# Patient Record
Sex: Female | Born: 2005 | Race: Black or African American | Hispanic: No | Marital: Single
Health system: Southern US, Community
[De-identification: ages and names within clinical notes are randomized; demographics above are authoritative.]

---
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
## 2023-01-28 IMAGING — DX DG KNEE COMPLETE 4+V*L*
4 series · 4 of 4 positions shown · non-contrast
Comparison: None.

CLINICAL DATA: Fall 3 days ago. Left knee pain and swelling.
Initial encounter.

EXAM:
LEFT KNEE - COMPLETE 4+ VIEW

[knee ap]
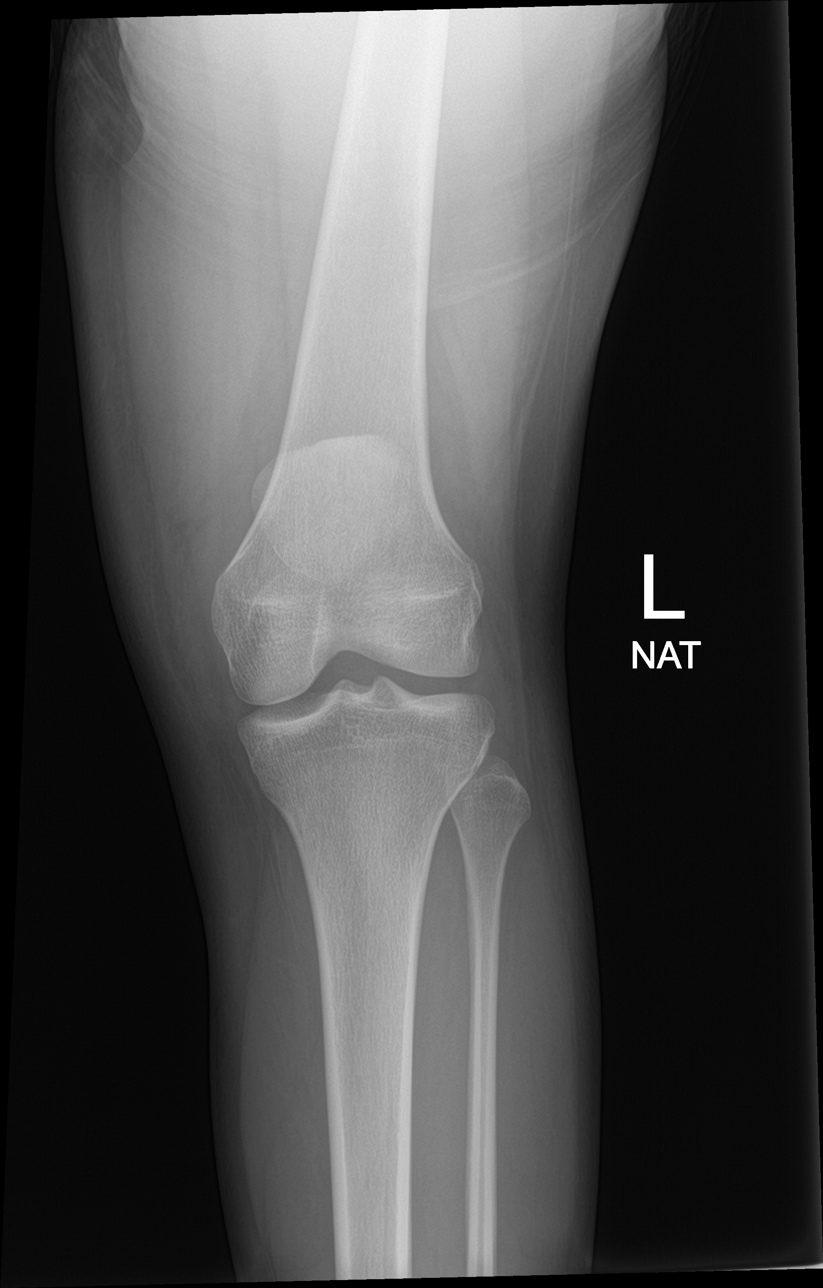

[knee obl (1 of 2)]
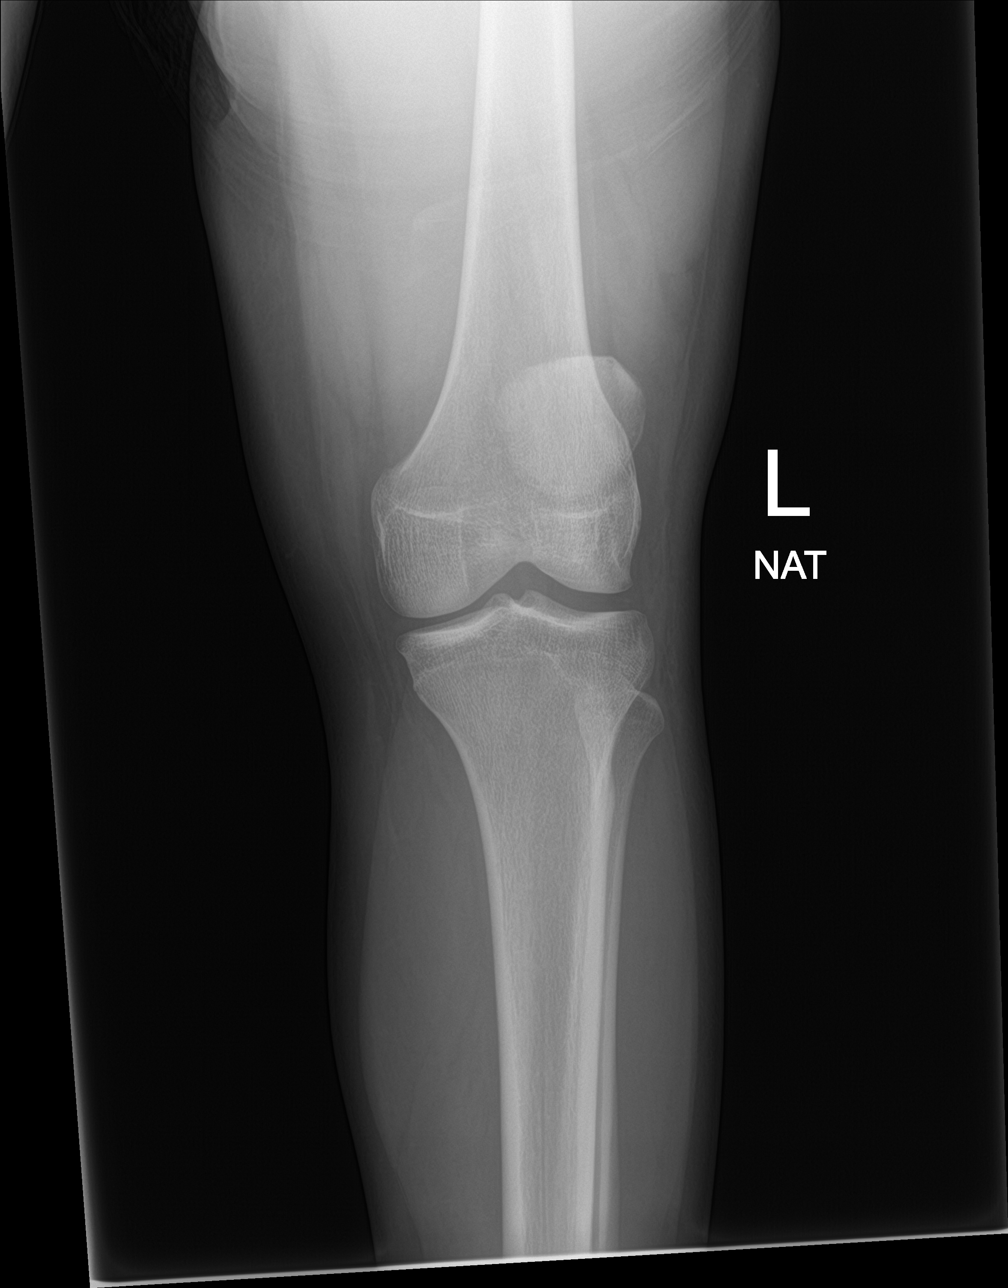

[knee obl (2 of 2)]
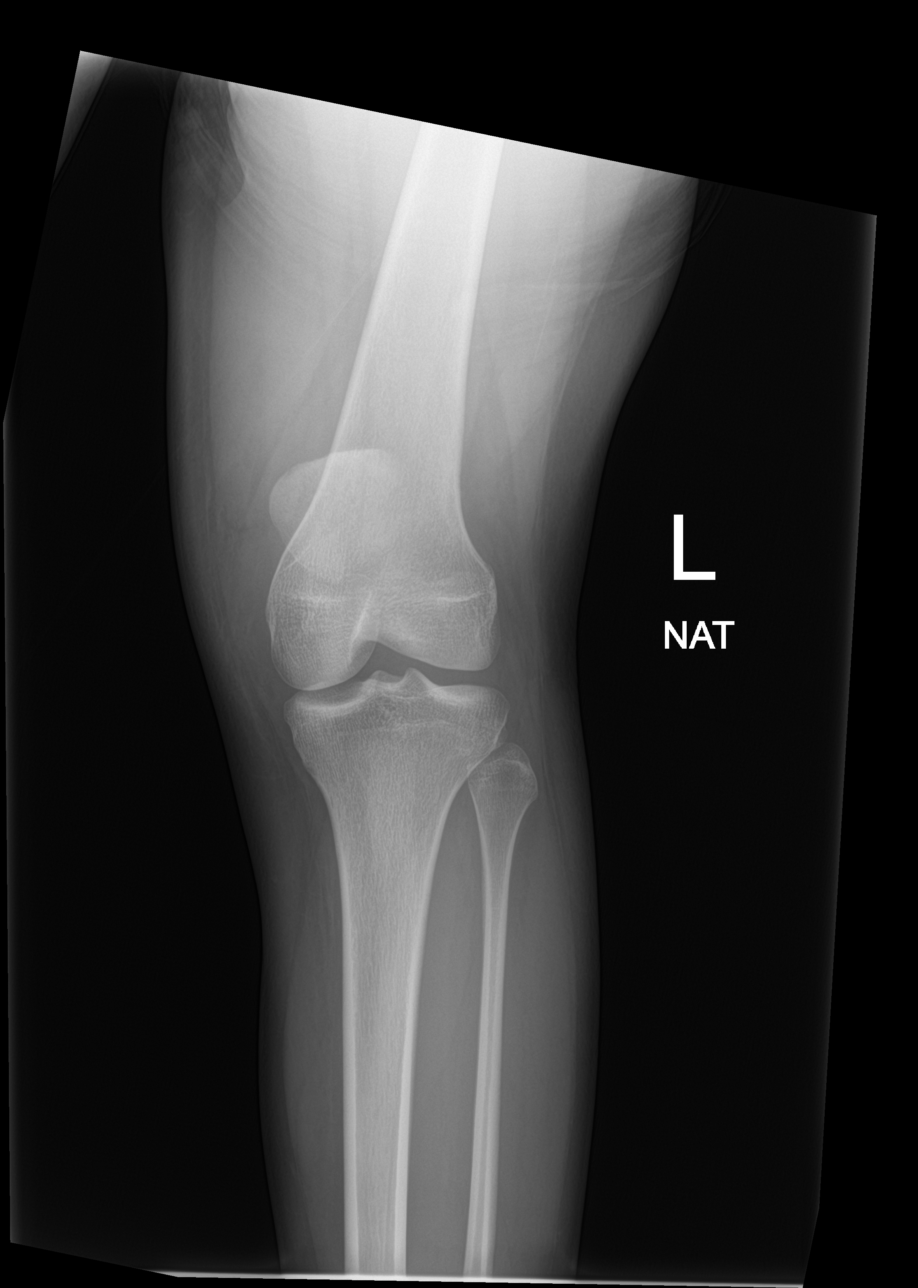

[knee lat]
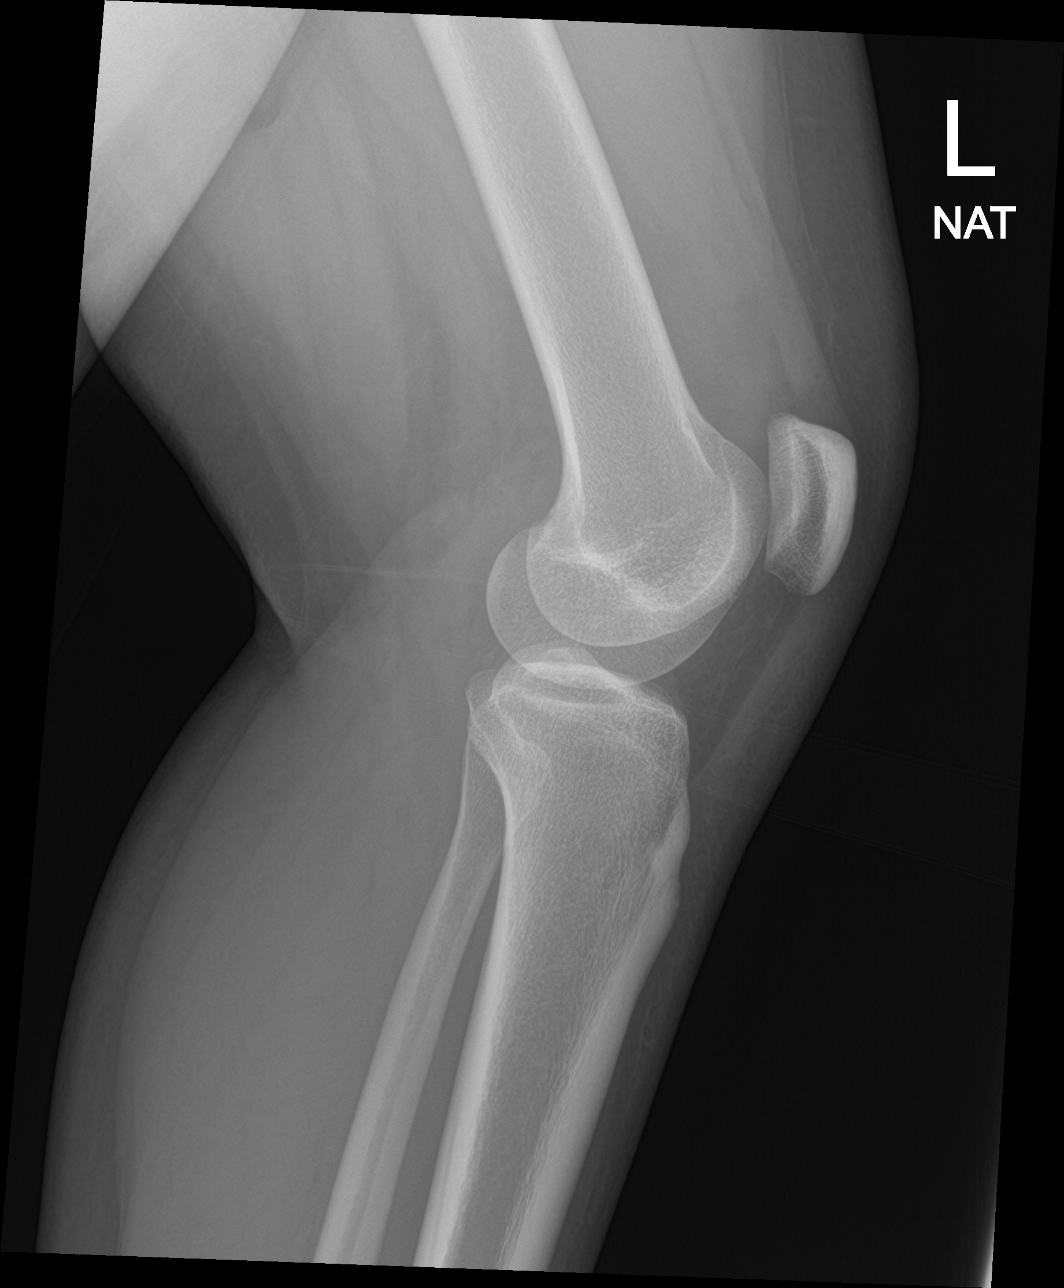

[4 of 4 positions shown; findings below may reference images not displayed]

FINDINGS: No evidence of fracture, dislocation, or joint effusion. No evidence
of arthropathy or other focal bone abnormality. Soft tissues are
unremarkable.
IMPRESSION: Negative.

## 2023-08-26 ENCOUNTER — Ambulatory Visit (HOSPITAL_COMMUNITY)
Admission: RE | Admit: 2023-08-26 | Discharge: 2023-08-26 | Disposition: A | Discharge status: Home / Self Care | Payer: PPO | Source: Ambulatory Visit | Attending: Family Medicine | Admitting: Family Medicine

## 2023-08-26 ENCOUNTER — Encounter (HOSPITAL_COMMUNITY): Payer: Self-pay

## 2023-08-26 VITALS — BP 119/76 | HR 62 | Temp 98.9°F | Resp 16

## 2023-08-26 DIAGNOSIS — Z113 Encounter for screening for infections with a predominantly sexual mode of transmission: Secondary | ICD-10-CM | POA: Diagnosis present

## 2023-08-26 NOTE — ED Triage Notes (Signed)
Pt here  for routine STD testing. Denies sx's. States has had unprotected intercourse.

## 2023-08-26 NOTE — ED Provider Notes (Addendum)
  [facility]   [id number] 08/26/23 Arrival Time: 1258  ASSESSMENT & PLAN:  1. Screening for STDs (sexually transmitted diseases)      Discharge Instructions      We have sent testing for sexually transmitted infections. We will notify you of any positive results once they are received. If required, we will prescribe any medications you might need.  Please refrain from all sexual activity for at least the next seven days.     Without s/s of PID.  Labs Reviewed  CERVICOVAGINAL ANCILLARY ONLY   Declines HIV/RPR.    Will notify of any positive results. Instructed to refrain from sexual activity for at least seven days.  Reviewed expectations re: course of current medical issues. Questions answered. Outlined signs and symptoms indicating need for more acute intervention. Patient verbalized understanding. After Visit Summary given.   SUBJECTIVE:  Monique Colon is a 18 y.o. female who requests STI testing. No symptoms. Recent unprotected intercourse.   Patient's last menstrual period was 08/04/2023 (approximate).   OBJECTIVE:  Vitals:   08/26/23 1342  BP: 119/76  Pulse: 62  Resp: 16  Temp: 98.9 F (37.2 C)  TempSrc: Oral  SpO2: 98%     General appearance: alert, cooperative, appears stated age and no distress Lungs: unlabored respirations; speaks full sentences without difficulty Back: no CVA tenderness; FROM at waist Abdomen: soft, non-tender GU: deferred Skin: warm and dry Psychological: alert and cooperative; normal mood and affect.    Labs Reviewed  CERVICOVAGINAL ANCILLARY ONLY    No Known Allergies  History reviewed. No pertinent past medical history. History reviewed. No pertinent family history. Social History   Socioeconomic History   Marital status: Single    Spouse name: Not on file   Number of children: Not on file   Years of education: Not on file   Highest education level: Not on file  Occupational History    Not on file  Tobacco Use   Smoking status: Passive Smoke Exposure - Never Smoker   Smokeless tobacco: Never  Vaping Use   Vaping status: Every Day  Substance and Sexual Activity   Alcohol use: Not Currently   Drug use: Yes    Types: Marijuana   Sexual activity: Yes    Birth control/protection: None  Other Topics Concern   Not on file  Social History Narrative   Not on file   Social Drivers of Health   Financial Resource Strain: Not on File (01/16/2021)   Received from [employer]    Financial Resource Strain: 0  Food Insecurity: Not on File (01/16/2021)   Received from [facility] Insecurity    Food: 0  Transportation Needs: Not on File (01/16/2021)   Received from [employer] Needs    Transportation: 0  Physical Activity: Not on File (01/16/2021)   Received from [facility]   Physical Activity    Physical Activity: 0  Stress: Not on File (01/16/2021)   Received from [facility]   Stress    Stress: 0  Social Connections: Not on File (01/16/2021)   Received from [facility]   Social Connections    Connectedness: 0  Intimate Partner Violence: Not on file           [place], MD 08/26/23 1636    Mardella Layman, MD 08/26/23 [phone]

## 2023-08-26 NOTE — Discharge Instructions (Signed)
We have sent testing for sexually transmitted infections. We will notify you of any positive results once they are received. If required, we will prescribe any medications you might need.  Please refrain from all sexual activity for at least the next seven days.

## 2023-08-28 LAB — CERVICOVAGINAL ANCILLARY ONLY
Chlamydia: NEGATIVE
Comment: NEGATIVE
Comment: NEGATIVE
Comment: NORMAL
Neisseria Gonorrhea: NEGATIVE
Trichomonas: NEGATIVE

## 2024-04-03 ENCOUNTER — Encounter (HOSPITAL_COMMUNITY): Payer: Self-pay

## 2024-04-03 ENCOUNTER — Other Ambulatory Visit: Payer: Self-pay

## 2024-04-03 ENCOUNTER — Inpatient Hospital Stay (HOSPITAL_COMMUNITY)
Admission: RE | Admit: 2024-04-03 | Discharge: 2024-04-03 | Discharge status: Home / Self Care | Source: Ambulatory Visit | Attending: Internal Medicine | Admitting: Internal Medicine

## 2024-04-03 VITALS — BP 103/67 | HR 62 | Temp 98.0°F | Resp 18

## 2024-04-03 DIAGNOSIS — Z113 Encounter for screening for infections with a predominantly sexual mode of transmission: Secondary | ICD-10-CM | POA: Insufficient documentation

## 2024-04-03 NOTE — Discharge Instructions (Addendum)
 Screening swab done today and results will be available in 24-48 hours. We will contact you if we need to arrange additional treatment based on your testing. Negative results will be on your MyChart account. Abstain from sex until you receive your final results.  Use a condom for sexual encounters. If you have any worsening or changing symptoms including abnormal discharge, pelvic pain, abdominal pain, fever, nausea, or vomiting, then you should be reevaluated.

## 2024-04-03 NOTE — ED Triage Notes (Signed)
Pt requesting to be check for STD.

## 2024-04-03 NOTE — ED Provider Notes (Signed)
 [facility]    CSN: [id number] Arrival date & time: 04/03/24  1348      History   Chief Complaint Chief Complaint  Patient presents with   SEXUALLY TRANSMITTED DISEASE    Entered by patient    HPI Monique Colon is a 18 y.o. female.   18 year old female presents urgent care requesting screening examination for STI.  She denies any vaginal discharge, pain, dysuria, hematuria or abdominal pain.  She denies any known exposures.     History reviewed. No pertinent past medical history.  There are no active problems to display for this patient.   History reviewed. No pertinent surgical history.  OB History   No obstetric history on file.      Home Medications    Prior to Admission medications   Not on File    Family History History reviewed. No pertinent family history.  Social History Social History   Tobacco Use   Smoking status: Passive Smoke Exposure - Never Smoker   Smokeless tobacco: Never  Vaping Use   Vaping status: Every Day  Substance Use Topics   Alcohol use: Not Currently   Drug use: Yes    Types: Marijuana     Allergies   Patient has no known allergies.   Review of Systems Review of Systems  Constitutional:  Negative for chills and fever.  HENT:  Negative for ear pain and sore throat.   Eyes:  Negative for pain and visual disturbance.  Respiratory:  Negative for cough and shortness of breath.   Cardiovascular:  Negative for chest pain and palpitations.  Gastrointestinal:  Negative for abdominal pain and vomiting.  Genitourinary:  Negative for dysuria and hematuria.  Musculoskeletal:  Negative for arthralgias and back pain.  Skin:  Negative for color change and rash.  Neurological:  Negative for seizures and syncope.  All other systems reviewed and are negative.    Physical Exam Triage Vital Signs ED Triage Vitals [04/03/24 1415]  Encounter Vitals Group     BP 103/67     Girls Systolic BP Percentile       Girls Diastolic BP Percentile      Boys Systolic BP Percentile      Boys Diastolic BP Percentile      Pulse Rate 62     Resp 18     Temp 98 F (36.7 C)     Temp Source Oral     SpO2 98 %     Weight      Height      Head Circumference      Peak Flow      Pain Score 0     Pain Loc      Pain Education      Exclude from Growth Chart    No data found.  Updated Vital Signs BP 103/67 (BP Location: Right Arm)   Pulse 62   Temp 98 F (36.7 C) (Oral)   Resp 18   LMP 03/21/2024 (Exact Date)   SpO2 98%   Visual Acuity Right Eye Distance:   Left Eye Distance:   Bilateral Distance:    Right Eye Near:   Left Eye Near:    Bilateral Near:     Physical Exam Vitals and nursing note reviewed.  Constitutional:      General: She is not in acute distress.    Appearance: She is well-developed.  HENT:     Head: Normocephalic and atraumatic.  Eyes:     Conjunctiva/sclera:  Conjunctivae normal.  Cardiovascular:     Rate and Rhythm: Normal rate and regular rhythm.     Heart sounds: No murmur heard. Pulmonary:     Effort: Pulmonary effort is normal. No respiratory distress.     Breath sounds: Normal breath sounds.  Abdominal:     Palpations: Abdomen is soft.     Tenderness: There is no abdominal tenderness.  Musculoskeletal:        General: No swelling.     Cervical back: Neck supple.  Skin:    General: Skin is warm and dry.     Capillary Refill: Capillary refill takes less than 2 seconds.  Neurological:     Mental Status: She is alert.  Psychiatric:        Mood and Affect: Mood normal.      UC Treatments / Results  Labs (all labs ordered are listed, but only abnormal results are displayed) Labs Reviewed  CERVICOVAGINAL ANCILLARY ONLY    EKG   Radiology No results found.  Procedures Procedures (including critical care time)  Medications Ordered in UC Medications - No data to display  Initial Impression / Assessment and Plan / UC Course  I have reviewed the  triage vital signs and the nursing notes.  Pertinent labs & imaging results that were available during my care of the patient were reviewed by me and considered in my medical decision making (see chart for details).     Screening examination for STI   Screening swab done today and results will be available in 24-48 hours. We will contact you if we need to arrange additional treatment based on your testing. Negative results will be on your MyChart account. Abstain from sex until you receive your final results.  Use a condom for sexual encounters. If you have any worsening or changing symptoms including abnormal discharge, pelvic pain, abdominal pain, fever, nausea, or vomiting, then you should be reevaluated.    Final Clinical Impressions(s) / UC Diagnoses   Final diagnoses:  Screening examination for STI     Discharge Instructions      Screening swab done today and results will be available in 24-48 hours. We will contact you if we need to arrange additional treatment based on your testing. Negative results will be on your MyChart account. Abstain from sex until you receive your final results.  Use a condom for sexual encounters. If you have any worsening or changing symptoms including abnormal discharge, pelvic pain, abdominal pain, fever, nausea, or vomiting, then you should be reevaluated.      ED Prescriptions   None    PDMP not reviewed this encounter.   Teresa Almarie LABOR, PA-C 04/03/24 1445

## 2024-04-05 LAB — CERVICOVAGINAL ANCILLARY ONLY
Chlamydia: POSITIVE — AB
Comment: NEGATIVE
Comment: NEGATIVE
Comment: NORMAL
Neisseria Gonorrhea: NEGATIVE
Trichomonas: POSITIVE — AB

## 2024-04-06 ENCOUNTER — Ambulatory Visit (HOSPITAL_COMMUNITY): Payer: Self-pay

## 2024-04-06 MED ORDER — METRONIDAZOLE 500 MG PO TABS: 500.0000 mg | ORAL_TABLET | Freq: Two times a day (BID) | ORAL | 0 refills | Status: AC

## 2024-04-06 MED ORDER — DOXYCYCLINE HYCLATE 100 MG PO TABS: 100.0000 mg | ORAL_TABLET | Freq: Two times a day (BID) | ORAL | 0 refills | Status: AC

## 2024-05-18 ENCOUNTER — Ambulatory Visit (HOSPITAL_COMMUNITY)

## 2024-06-27 ENCOUNTER — Ambulatory Visit (HOSPITAL_COMMUNITY)

## 2024-07-11 ENCOUNTER — Encounter (HOSPITAL_COMMUNITY): Payer: Self-pay

## 2024-07-11 ENCOUNTER — Ambulatory Visit (HOSPITAL_COMMUNITY)
Admission: RE | Admit: 2024-07-11 | Discharge: 2024-07-11 | Disposition: A | Discharge status: Home / Self Care | Source: Ambulatory Visit | Attending: Student

## 2024-07-11 VITALS — BP 108/66 | HR 74 | Temp 98.5°F | Resp 16

## 2024-07-11 DIAGNOSIS — Z3202 Encounter for pregnancy test, result negative: Secondary | ICD-10-CM

## 2024-07-11 DIAGNOSIS — Z113 Encounter for screening for infections with a predominantly sexual mode of transmission: Secondary | ICD-10-CM

## 2024-07-11 LAB — POCT URINE PREGNANCY: Preg Test, Ur: NEGATIVE

## 2024-07-11 NOTE — ED Provider Notes (Signed)
 [facility]    CSN: [id number] Arrival date & time: 07/11/24  1305      History   Chief Complaint Chief Complaint  Patient presents with   SEXUALLY TRANSMITTED DISEASE    Entered by patient    HPI Mixtli Isis Melnyk is a 18 y.o. female presenting for STI screen (asymptomatic).  She denies new partners or symptoms.  HPI  History reviewed. No pertinent past medical history.  There are no active problems to display for this patient.   History reviewed. No pertinent surgical history.  OB History   No obstetric history on file.      Home Medications    Prior to Admission medications   Not on File    Family History History reviewed. No pertinent family history.  Social History Social History   Tobacco Use   Smoking status: Passive Smoke Exposure - Never Smoker   Smokeless tobacco: Never  Vaping Use   Vaping status: Every Day  Substance Use Topics   Alcohol use: Not Currently   Drug use: Yes    Types: Marijuana     Allergies   Patient has no known allergies.   Review of Systems Review of Systems  Constitutional:  Negative for appetite change, chills, diaphoresis and fever.  Respiratory:  Negative for shortness of breath.   Cardiovascular:  Negative for chest pain.  Gastrointestinal:  Negative for abdominal pain, blood in stool, constipation, diarrhea, nausea and vomiting.  Genitourinary:  Negative for decreased urine volume, difficulty urinating, dysuria, flank pain, frequency, genital sores, hematuria, urgency, vaginal bleeding, vaginal discharge and vaginal pain.  Musculoskeletal:  Negative for back pain.  Neurological:  Negative for dizziness, weakness and light-headedness.  All other systems reviewed and are negative.    Physical Exam Triage Vital Signs ED Triage Vitals [07/11/24 1322]  Encounter Vitals Group     BP      Girls Systolic BP Percentile      Girls Diastolic BP Percentile      Boys Systolic BP Percentile       Boys Diastolic BP Percentile      Pulse      Resp      Temp      Temp src      SpO2      Weight      Height      Head Circumference      Peak Flow      Pain Score 0     Pain Loc      Pain Education      Exclude from Growth Chart    No data found.  Updated Vital Signs BP 108/66 (BP Location: Left Arm)   Pulse 74   Temp 98.5 F (36.9 C) (Oral)   Resp 16   LMP 06/18/2024 (Approximate)   SpO2 98%   Visual Acuity Right Eye Distance:   Left Eye Distance:   Bilateral Distance:    Right Eye Near:   Left Eye Near:    Bilateral Near:     Physical Exam Vitals reviewed.  Constitutional:      General: She is not in acute distress.    Appearance: Normal appearance. She is not ill-appearing.  HENT:     Head: Normocephalic and atraumatic.  Pulmonary:     Effort: Pulmonary effort is normal.  Neurological:     General: No focal deficit present.     Mental Status: She is alert and oriented to person, place, and time.  Psychiatric:  Mood and Affect: Mood normal.        Behavior: Behavior normal.        Thought Content: Thought content normal.        Judgment: Judgment normal.      UC Treatments / Results  Labs (all labs ordered are listed, but only abnormal results are displayed) Labs Reviewed  POCT URINE PREGNANCY  CERVICOVAGINAL ANCILLARY ONLY    EKG   Radiology No results found.  Procedures Procedures (including critical care time)  Medications Ordered in UC Medications - No data to display  Initial Impression / Assessment and Plan / UC Course  I have reviewed the triage vital signs and the nursing notes.  Pertinent labs & imaging results that were available during my care of the patient were reviewed by me and considered in my medical decision making (see chart for details).     Patient is a pleasant 18 y.o. female presenting with STI screen - asymptomatic. The patient is afebrile and nontachycardic.  Antipyretic has not been administered  today.  Urine pregnancy negative.  Self swab collected for yeast, gonorrhea, chlamydia, trichomonas, BV  Safe sex precautions  Final Clinical Impressions(s) / UC Diagnoses   Final diagnoses:  Routine screening for STI (sexually transmitted infection)     Discharge Instructions      - We are testing for gonorrhea, chlamydia, trichomonas, BV, and yeast. -We will call with any positive results in about 3 business days and can send treatment if necessary. -We will not call with negative lab results. -Lab results will automatically go to your MyChart.      ED Prescriptions   None    PDMP not reviewed this encounter.   Arlyss Leita BRAVO, PA-C 07/11/24 1353

## 2024-07-11 NOTE — ED Triage Notes (Signed)
 Patient here today to be tested for Stds. (Swab only). Denies symptoms or known exposure.

## 2024-07-11 NOTE — Discharge Instructions (Signed)
-   We are testing for gonorrhea, chlamydia, trichomonas, BV, and yeast. -We will call with any positive results in about 3 business days and can send treatment if necessary. -We will not call with negative lab results. -Lab results will automatically go to your MyChart.

## 2024-07-12 ENCOUNTER — Ambulatory Visit (HOSPITAL_COMMUNITY): Payer: Self-pay

## 2024-07-12 LAB — CERVICOVAGINAL ANCILLARY ONLY
Bacterial Vaginitis (gardnerella): POSITIVE — AB
Candida Glabrata: NEGATIVE
Candida Vaginitis: POSITIVE — AB
Chlamydia: NEGATIVE
Comment: NEGATIVE
Comment: NEGATIVE
Comment: NEGATIVE
Comment: NEGATIVE
Comment: NEGATIVE
Comment: NORMAL
Neisseria Gonorrhea: NEGATIVE
Trichomonas: NEGATIVE

## 2024-07-12 MED ORDER — FLUCONAZOLE 150 MG PO TABS: 150.0000 mg | ORAL_TABLET | Freq: Once | ORAL | 0 refills | Status: AC
